# Patient Record
Sex: Female | Born: 1990
Health system: Southern US, Community
[De-identification: ages and names within clinical notes are randomized; demographics above are authoritative.]

---
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
## 2015-01-20 ENCOUNTER — Emergency Department (HOSPITAL_COMMUNITY)
Admission: EM | Admit: 2015-01-20 | Discharge: 2015-01-20 | Disposition: A | Discharge status: Home / Self Care | Payer: BLUE CROSS/BLUE SHIELD | Attending: Emergency Medicine | Admitting: Emergency Medicine

## 2015-01-20 ENCOUNTER — Encounter (HOSPITAL_COMMUNITY): Payer: Self-pay | Admitting: *Deleted

## 2015-01-20 DIAGNOSIS — K068 Other specified disorders of gingiva and edentulous alveolar ridge: Secondary | ICD-10-CM | POA: Diagnosis not present

## 2015-01-20 DIAGNOSIS — K088 Other specified disorders of teeth and supporting structures: Secondary | ICD-10-CM | POA: Diagnosis present

## 2015-01-20 DIAGNOSIS — K029 Dental caries, unspecified: Secondary | ICD-10-CM | POA: Insufficient documentation

## 2015-01-20 LAB — CBC WITH DIFFERENTIAL/PLATELET
BASOS ABS: 0 10*3/uL (ref 0.0–0.1)
Basophils Relative: 0 % (ref 0–1)
EOS ABS: 0 10*3/uL (ref 0.0–0.7)
Eosinophils Relative: 0 % (ref 0–5)
HCT: 38.5 % (ref 36.0–46.0)
HEMOGLOBIN: 13.3 g/dL (ref 12.0–15.0)
LYMPHS ABS: 2.3 10*3/uL (ref 0.7–4.0)
Lymphocytes Relative: 41 % (ref 12–46)
MCH: 28.7 pg (ref 26.0–34.0)
MCHC: 34.5 g/dL (ref 30.0–36.0)
MCV: 83.2 fL (ref 78.0–100.0)
MONOS PCT: 13 % — AB (ref 3–12)
Monocytes Absolute: 0.7 10*3/uL (ref 0.1–1.0)
NEUTROS PCT: 46 % (ref 43–77)
Neutro Abs: 2.6 10*3/uL (ref 1.7–7.7)
Platelets: 243 10*3/uL (ref 150–400)
RBC: 4.63 MIL/uL (ref 3.87–5.11)
RDW: 12.6 % (ref 11.5–15.5)
WBC: 5.6 10*3/uL (ref 4.0–10.5)

## 2015-01-20 NOTE — ED Notes (Signed)
Declined W/C at D/C and was escorted to lobby by RN. 

## 2015-01-20 NOTE — Discharge Instructions (Signed)
Dental Care and Dentist Visits Follow-up with a dentist regarding your bleeding gums. Her platelets are normal. Dental care supports good overall health. Regular dental visits can also help you avoid dental pain, bleeding, infection, and other more serious health problems in the future. It is important to keep the mouth healthy because diseases in the teeth, gums, and other oral tissues can spread to other areas of the body. Some problems, such as diabetes, heart disease, and pre-term labor have been associated with poor oral health.  See your dentist every 6 months. If you experience emergency problems such as a toothache or broken tooth, go to the dentist right away. If you see your dentist regularly, you may catch problems early. It is easier to be treated for problems in the early stages.  WHAT TO EXPECT AT A DENTIST VISIT  Your dentist will look for many common oral health problems and recommend proper treatment. At your regular dental visit, you can expect:  Gentle cleaning of the teeth and gums. This includes scraping and polishing. This helps to remove the sticky substance around the teeth and gums (plaque). Plaque forms in the mouth shortly after eating. Over time, plaque hardens on the teeth as tartar. If tartar is not removed regularly, it can cause problems. Cleaning also helps remove stains.  Periodic X-rays. These pictures of the teeth and supporting bone will help your dentist assess the health of your teeth.  Periodic fluoride treatments. Fluoride is a natural mineral shown to help strengthen teeth. Fluoride treatmentinvolves applying a fluoride gel or varnish to the teeth. It is most commonly done in children.  Examination of the mouth, tongue, jaws, teeth, and gums to look for any oral health problems, such as:  Cavities (dental caries). This is decay on the tooth caused by plaque, sugar, and acid in the mouth. It is best to catch a cavity when it is small.  Inflammation of  the gums caused by plaque buildup (gingivitis).  Problems with the mouth or malformed or misaligned teeth.  Oral cancer or other diseases of the soft tissues or jaws. KEEP YOUR TEETH AND GUMS HEALTHY For healthy teeth and gums, follow these general guidelines as well as your dentist's specific advice:  Have your teeth professionally cleaned at the dentist every 6 months.  Brush twice daily with a fluoride toothpaste.  Floss your teeth daily.  Ask your dentist if you need fluoride supplements, treatments, or fluoride toothpaste.  Eat a healthy diet. Reduce foods and drinks with added sugar.  Avoid smoking. TREATMENT FOR ORAL HEALTH PROBLEMS If you have oral health problems, treatment varies depending on the conditions present in your teeth and gums.  Your caregiver will most likely recommend good oral hygiene at each visit.  For cavities, gingivitis, or other oral health disease, your caregiver will perform a procedure to treat the problem. This is typically done at a separate appointment. Sometimes your caregiver will refer you to another dental specialist for specific tooth problems or for surgery. SEEK IMMEDIATE DENTAL CARE IF:  You have pain, bleeding, or soreness in the gum, tooth, jaw, or mouth area.  A permanent tooth becomes loose or separated from the gum socket.  You experience a blow or injury to the mouth or jaw area. Document Released: 02/05/2011 Document Revised: 08/18/2011 Document Reviewed: 02/05/2011 [facility] Patient Information 2015 [place], [place]. This information is not intended to replace advice given to you by your health care provider. Make sure you discuss any questions you have with  your health care provider. ° °

## 2015-01-20 NOTE — ED Notes (Signed)
Pt reports pain and bleeding to gumline x 3 days.

## 2015-01-20 NOTE — ED Provider Notes (Signed)
History  This chart was scribed for non-physician practitioner, Catha Gosselin, PA-C,working with Cathren Laine, MD, by Karle Plumber, ED Scribe. This patient was seen in room TR06C/TR06C and the patient's care was started at 3:00 PM.  Chief Complaint  Patient presents with  . Dental Pain   The history is provided by the patient and medical records. No language interpreter was used.    HPI Comments:  Morgan Phillips is a 24 y.o. female who presents to the Emergency Department complaining of gum swelling that began four days ago. She reports associated gum bleeding when she brushes and reports brushing daily. She states she has had similar symptoms in the past secondary to a "bad" upper right-sided tooth. She reports not having been seen by a dentist in about two years. She has not taken anything for the pain. She denies modifying factors. She denies fever, chills, nausea or vomiting. She denies any easy bruising or petechiae on the legs. She states her last physical exam was approximately one year ago and she states it was normal.  History reviewed. No pertinent past medical history. History reviewed. No pertinent past surgical history. History reviewed. No pertinent family history. Social History  Substance Use Topics  . Smoking status: Never Smoker   . Smokeless tobacco: None  . Alcohol Use: No   OB History    No data available     Review of Systems  Constitutional: Negative for fever and chills.  HENT: Positive for dental problem.   Gastrointestinal: Negative for nausea and vomiting.    Allergies  Review of patient's allergies indicates no known allergies.  Home Medications   Prior to Admission medications   Not on File   Triage Vitals: BP 109/64 mmHg  Pulse 84  Temp(Src) 98.5 F (36.9 C) (Oral)  Resp 18  SpO2 100%  LMP 01/08/2015 Physical Exam  Constitutional: She is oriented to person, place, and time. She appears well-developed and well-nourished.  HENT:   Head: Normocephalic and atraumatic.  Mouth/Throat: Uvula is midline, oropharynx is clear and moist and mucous membranes are normal. No trismus in the jaw. Abnormal dentition. Dental caries present. No dental abscesses or uvula swelling.  Mild gum hyperplasia but without active bleeding.  Eyes: EOM are normal.  Neck: Normal range of motion.  Cardiovascular: Normal rate.   Pulmonary/Chest: Effort normal. No respiratory distress.  Abdominal: Soft.  Musculoskeletal: Normal range of motion.  Neurological: She is alert and oriented to person, place, and time.  Skin: Skin is warm and dry.  Psychiatric: She has a normal mood and affect. Her behavior is normal.  Nursing note and vitals reviewed.   ED Course  Procedures (including critical care time) DIAGNOSTIC STUDIES: Oxygen Saturation is 100% on RA, normal by my interpretation.   COORDINATION OF CARE: 3:04 PM- Will draw labs and refer to dentist. Pt verbalizes understanding and agrees to plan.  Medications - No data to display  Labs Review Labs Reviewed  CBC WITH DIFFERENTIAL/PLATELET    Imaging Review No results found.    EKG Interpretation None      MDM   Final diagnoses:  Bleeding gums  Patient presents for gum swelling after brushing her teeth for the past 3 days. Vitals are stable. I checked platelets to make sure this was not related to thrombocytopenia. I referred the patient to a dentist. I discussed not using blistering or mouthwash until she has seen a dentist. She verbally agrees with the plan.  I personally performed the services described in  this documentation, which was scribed in my presence. The recorded information has been reviewed and is accurate.    Catha Gosselin, PA-C 01/20/15 1614  Cathren Laine, MD 01/21/15 [phone]

## 2018-01-12 ENCOUNTER — Emergency Department (HOSPITAL_BASED_OUTPATIENT_CLINIC_OR_DEPARTMENT_OTHER): Payer: Self-pay

## 2018-01-12 ENCOUNTER — Encounter (HOSPITAL_BASED_OUTPATIENT_CLINIC_OR_DEPARTMENT_OTHER): Payer: Self-pay | Admitting: *Deleted

## 2018-01-12 ENCOUNTER — Other Ambulatory Visit: Payer: Self-pay

## 2018-01-12 ENCOUNTER — Emergency Department (HOSPITAL_BASED_OUTPATIENT_CLINIC_OR_DEPARTMENT_OTHER)
Admission: EM | Admit: 2018-01-12 | Discharge: 2018-01-12 | Disposition: A | Discharge status: Home / Self Care | Payer: Self-pay | Attending: Emergency Medicine | Admitting: Emergency Medicine

## 2018-01-12 DIAGNOSIS — N10 Acute tubulo-interstitial nephritis: Secondary | ICD-10-CM | POA: Insufficient documentation

## 2018-01-12 DIAGNOSIS — N12 Tubulo-interstitial nephritis, not specified as acute or chronic: Secondary | ICD-10-CM

## 2018-01-12 LAB — URINALYSIS, ROUTINE W REFLEX MICROSCOPIC
Bilirubin Urine: NEGATIVE
Glucose, UA: NEGATIVE mg/dL
Ketones, ur: NEGATIVE mg/dL
NITRITE: NEGATIVE
Protein, ur: NEGATIVE mg/dL
SPECIFIC GRAVITY, URINE: 1.015 (ref 1.005–1.030)
pH: 6 (ref 5.0–8.0)

## 2018-01-12 LAB — URINALYSIS, MICROSCOPIC (REFLEX)

## 2018-01-12 LAB — COMPREHENSIVE METABOLIC PANEL
ALT: 15 U/L (ref 0–44)
AST: 22 U/L (ref 15–41)
Albumin: 4.5 g/dL (ref 3.5–5.0)
Alkaline Phosphatase: 58 U/L (ref 38–126)
Anion gap: 12 (ref 5–15)
BILIRUBIN TOTAL: 0.7 mg/dL (ref 0.3–1.2)
BUN: 12 mg/dL (ref 6–20)
CHLORIDE: 102 mmol/L (ref 98–111)
CO2: 24 mmol/L (ref 22–32)
CREATININE: 0.99 mg/dL (ref 0.44–1.00)
Calcium: 9 mg/dL (ref 8.9–10.3)
GFR calc Af Amer: >60 mL/min (ref >60)
GFR calc non Af Amer: >60 mL/min (ref >60)
GLUCOSE: 102 mg/dL — AB (ref 70–99)
Potassium: 3.8 mmol/L (ref 3.5–5.1)
Sodium: 138 mmol/L (ref 135–145)
TOTAL PROTEIN: 8.9 g/dL — AB (ref 6.5–8.1)

## 2018-01-12 LAB — CBC WITH DIFFERENTIAL/PLATELET
BASOS ABS: 0 10*3/uL (ref 0.0–0.1)
Basophils Relative: 0 %
Eosinophils Absolute: 0 10*3/uL (ref 0.0–0.7)
Eosinophils Relative: 0 %
HEMATOCRIT: 40.9 % (ref 36.0–46.0)
Hemoglobin: 14.2 g/dL (ref 12.0–15.0)
LYMPHS PCT: 22 %
Lymphs Abs: 2 10*3/uL (ref 0.7–4.0)
MCH: 28.3 pg (ref 26.0–34.0)
MCHC: 34.7 g/dL (ref 30.0–36.0)
MCV: 81.5 fL (ref 78.0–100.0)
Monocytes Absolute: 1.3 10*3/uL — ABNORMAL HIGH (ref 0.1–1.0)
Monocytes Relative: 14 %
NEUTROS ABS: 6 10*3/uL (ref 1.7–7.7)
NEUTROS PCT: 64 %
Platelets: 271 10*3/uL (ref 150–400)
RBC: 5.02 MIL/uL (ref 3.87–5.11)
RDW: 12.8 % (ref 11.5–15.5)
WBC: 9.3 10*3/uL (ref 4.0–10.5)

## 2018-01-12 LAB — LIPASE, BLOOD: LIPASE: 26 U/L (ref 11–51)

## 2018-01-12 LAB — PREGNANCY, URINE: PREG TEST UR: NEGATIVE

## 2018-01-12 MED ORDER — IOPAMIDOL (ISOVUE-300) INJECTION 61%
100.0000 mL | Freq: Once | INTRAVENOUS | Status: AC | PRN
  Administered 2018-01-12: 100 mL via INTRAVENOUS

## 2018-01-12 MED ORDER — SULFAMETHOXAZOLE-TRIMETHOPRIM 800-160 MG PO TABS: 1.0000 | ORAL_TABLET | Freq: Two times a day (BID) | ORAL | 0 refills | Status: AC

## 2018-01-12 MED ORDER — SODIUM CHLORIDE 0.9 % IV SOLN
1.0000 g | Freq: Once | INTRAVENOUS | Status: AC
  Administered 2018-01-12: 1 g via INTRAVENOUS
  Filled 2018-01-12: qty 10

## 2018-01-12 MED ORDER — SULFAMETHOXAZOLE-TRIMETHOPRIM 800-160 MG PO TABS: 1.0000 | ORAL_TABLET | Freq: Two times a day (BID) | ORAL | 0 refills | Status: DC

## 2018-01-12 MED FILL — SULFAMETHOXAZOLE-TMP DS TAB: 800-160 | 14 days supply | Qty: 28 | Fill #0

## 2018-01-12 NOTE — ED Triage Notes (Signed)
Pt has had right sided flank pain radiating to back since Saturday. Pt is has nausea, and sharp pain, unable to eat much.

## 2018-01-12 NOTE — Discharge Instructions (Addendum)
If you get a fever, increased pain, or feel worse please come back to emergency room.  Please follow-up with your primary care doctor within 1 to 2 weeks of discharge.

## 2018-01-12 NOTE — ED Provider Notes (Signed)
[facility] EMERGENCY DEPARTMENT Provider Note   CSN: [id number] Arrival date & time: 01/12/18  1029     History   Chief Complaint Chief Complaint  Patient presents with  . Flank Pain    HPI Morgan Phillips is a 27 y.o. female.  Patient is a 27 year old female with no significant past medical history presenting with 3 days of right lower quadrant pain.  Patient says that pain begins in right lower quadrant and radiates to her right lower back.  Patient notes that she has also had associated nausea and one episode of emesis on Sunday.  Patient also had diarrhea on Saturday but this has stopped.  Patient initially thought she had a viral or enteritis which is why she did not seek care immediately.  Patient notes associated headache as well and she says she can "barely walk".  Patient notes dizziness and lightheadedness.  Patient notes urinary urgency and frequency but denies hematuria or dysuria.  Patient recently had menses which ended on Sunday.  Patient's last unprotected sexual intercourse was before her menses began.  Patient reports subjective fever and chills yesterday.  Patient denies any sick contacts.  Patient notes a decrease in appetite, says she has not been able to eat or drink much.     History reviewed. No pertinent past medical history.  There are no active problems to display for this patient.   History reviewed. No pertinent surgical history.   OB History   None      Home Medications    Prior to Admission medications   Medication Sig Start Date End Date Taking? Authorizing Provider  sulfamethoxazole-trimethoprim (BACTRIM DS,SEPTRA DS) 800-160 MG tablet Take 1 tablet by mouth 2 (two) times daily for 14 days. 01/12/18 01/26/18  Oralia Manis, DO    Family History History reviewed. No pertinent family history.  Social History Social History   Tobacco Use  . Smoking status: Never Smoker  . Smokeless tobacco: Never Used  Substance Use Topics  .  Alcohol use: No  . Drug use: No     Allergies   Patient has no known allergies.   Review of Systems Review of Systems  Constitutional: Positive for appetite change, chills and fever.  Respiratory:       Pain with deep breaths  Gastrointestinal: Positive for abdominal pain, diarrhea, nausea and vomiting. Negative for blood in stool.  Genitourinary: Positive for decreased urine volume, flank pain, frequency and urgency. Negative for dysuria and hematuria.  Musculoskeletal: Positive for back pain and gait problem.  Neurological: Positive for dizziness, light-headedness and headaches.     Physical Exam Updated Vital Signs BP 122/73 (BP Location: Right Arm)   Pulse 94   Temp 99.2 F (37.3 C) (Oral)   Resp 16   Ht 5\' 4"  (1.626 m)   Wt 78 kg (172 lb)   LMP 01/03/2018 (Exact Date)   SpO2 100%   BMI 29.52 kg/m   Physical Exam  Constitutional: She appears well-developed and well-nourished. No distress.  HENT:  Head: Normocephalic and atraumatic.  Mouth/Throat: No oropharyngeal exudate.  Eyes: Pupils are equal, round, and reactive to light. Conjunctivae are normal. Right eye exhibits no discharge. Left eye exhibits no discharge.  Neck: Neck supple.  Cardiovascular: Normal rate, regular rhythm and normal heart sounds.  No murmur heard. Pulmonary/Chest: Effort normal and breath sounds normal. No respiratory distress. She has no wheezes. She has no rales.  Abdominal: Soft. Bowel sounds are normal. She exhibits no mass. There is tenderness.  Tenderness to palpation of McBurney's point.  Negative psoas sign.  Negative obturator sign.  Musculoskeletal: Normal range of motion. She exhibits no edema or tenderness.  No CVA tenderness  Neurological: She is alert.  Skin: Skin is warm.  Psychiatric: She has a normal mood and affect.     ED Treatments / Results  Labs (all labs ordered are listed, but only abnormal results are displayed) Labs Reviewed  URINALYSIS, ROUTINE W REFLEX  MICROSCOPIC - Abnormal; Notable for the following components:      Result Value   Hgb urine dipstick SMALL (*)    Leukocytes, UA SMALL (*)    All other components within normal limits  CBC WITH DIFFERENTIAL/PLATELET - Abnormal; Notable for the following components:   Monocytes Absolute 1.3 (*)    All other components within normal limits  COMPREHENSIVE METABOLIC PANEL - Abnormal; Notable for the following components:   Glucose, Bld 102 (*)    Total Protein 8.9 (*)    All other components within normal limits  URINALYSIS, MICROSCOPIC (REFLEX) - Abnormal; Notable for the following components:   Bacteria, UA FEW (*)    All other components within normal limits  URINE CULTURE  PREGNANCY, URINE  LIPASE, BLOOD    EKG None  Radiology Ct Abdomen Pelvis W Contrast  Result Date: 01/12/2018 CLINICAL DATA:  Rt sided abd pain since sat, n/v/d sat and sun, now having constipation. EXAM: CT ABDOMEN AND PELVIS WITH CONTRAST TECHNIQUE: Multidetector CT imaging of the abdomen and pelvis was performed using the standard protocol following bolus administration of intravenous contrast. CONTRAST:  100mL ISOVUE-300 IOPAMIDOL (ISOVUE-300) INJECTION 61% COMPARISON:  None. FINDINGS: Lower chest: No acute abnormality. Hepatobiliary: No focal liver abnormality is seen. No radiopaque gallstones, biliary dilatation, or pericholecystic inflammatory changes. Pancreas: Unremarkable. No pancreatic ductal dilatation or surrounding inflammatory changes. Spleen: Normal in size without focal abnormality. Adrenals/Urinary Tract: Adrenal glands are normal. There is symmetric enhancement of both kidneys. There is a small amount of fluid within the RIGHT renal pelvis and mild RIGHT-sided hydronephrosis. No ureteral obstruction identified. The LEFT kidney and ureter are normal in appearance. Urinary bladder is unremarkable. Stomach/Bowel: The stomach and small bowel loops are normal in appearance. The appendix is well seen and has a  normal appearance. There is moderate stool burden. Loops of colon are otherwise normal in appearance. Vascular/Lymphatic: No significant vascular findings are present. No enlarged abdominal or pelvic lymph nodes. Reproductive: Uterus and bilateral adnexa are unremarkable. Other: No abdominal wall hernia or abnormality. No abdominopelvic ascites. Musculoskeletal: No acute or significant osseous findings. IMPRESSION: 1. Mild RIGHT hydronephrosis, RIGHT renal pelvic fluid, and enhancing wall of the proximal ureter consistent with pyelonephritis. No evidence for abscess or urinary tract obstruction. 2. Normal appendix. 3. Moderate stool burden. Electronically Signed   By: Norva PavlovElizabeth  Brown M.D.   On: 01/12/2018 12:17    Procedures Procedures (including critical care time)  Medications Ordered in ED Medications  cefTRIAXone (ROCEPHIN) 1 g in sodium chloride 0.9 % 100 mL IVPB (has no administration in time range)  iopamidol (ISOVUE-300) 61 % injection 100 mL (100 mLs Intravenous Contrast Given 01/12/18 1146)     Initial Impression / Assessment and Plan / ED Course  I have reviewed the triage vital signs and the nursing notes.  Pertinent labs & imaging results that were available during my care of the patient were reviewed by me and considered in my medical decision making (see chart for details).     Patient with increased pain on  palpation of McBurney's point.  Given that there is tenderness to palpation right lower quadrant as well as history of anorexia, subjective fevers, and nausea/vomiting this is concerning for possible appendicitis.  Will obtain CT abdomen pelvis with contrast to rule out appendicitis.  Patient also with several urinary complaints such as urgency and frequency.  Will obtain UA as well as urine culture to rule out any urinary tract infections.  Will obtain urine pregnancy test to rule out possible ectopic pregnancy.  Will obtain lipase as well to rule out pancreatitis.  CBC and CMP  pending as well.  If CT is negative we will plan to do pelvic exam further work-up for pelvic pain and possible STD exposure.  12:40: CT scan showing mild right hydronephrosis, right renal pelvic fluid, and enhancing wall of the proximal ureter consistent with pyelonephritis.  No evidence of abscess or urinary tract obstruction seen in CT.  Normal appendix seen.  Moderate stool burden as well.  UA showing small leukocytes in small amount of hemoglobin as well as bacteria.  Pregnant negative.  Given these results, will treat for pyelonephritis with 1 g Rocephin in ED and then 14 days of Bactrim twice daily as outpatient.  Patient to follow-up with PCP in 1 to 2 weeks.  Strict return precautions given.  Patient stable for discharge.  Patient discussed and independently examined by Dr. Jacqulyn Bath who agrees with plan Final Clinical Impressions(s) / ED Diagnoses   Final diagnoses:  Pyelonephritis    ED Discharge Orders        Ordered    sulfamethoxazole-trimethoprim (BACTRIM DS,SEPTRA DS) 800-160 MG tablet  2 times daily     01/12/18 1240       Oralia Manis, DO 01/12/18 1247    Long, Arlyss Repress, MD 01/13/18 [phone]

## 2018-01-12 NOTE — ED Notes (Signed)
NAD at this time. Pt is stable and going home.  

## 2018-01-14 LAB — URINE CULTURE: Special Requests: NORMAL

## 2018-01-15 ENCOUNTER — Telehealth: Payer: Self-pay

## 2018-01-15 NOTE — Telephone Encounter (Signed)
Post ED Visit - Positive Culture Follow-up  Culture report reviewed by antimicrobial stewardship pharmacist:  []  Enzo BiNathan Batchelder, Pharm.D. []  Celedonio MiyamotoJeremy Frens, Pharm.D., BCPS AQ-ID []  Garvin FilaMike Maccia, Pharm.D., BCPS []  Georgina PillionElizabeth Martin, [address]Ph[address] BCPS []  UlenMinh Pham, [address]Pharm[address]D., BCPS, AAHIVP []  Estella HuskMichelle Turner, Pharm.D., BCPS, AAHIVP []  Lysle Pearlachel Rumbarger, PharmD, BCPS []  Phillips Climeshuy Dang, PharmD, BCPS []  Agapito GamesAlison Masters, PharmD, BCPS []  Verlan FriendsErin Deja, PharmD Philomena Coursehompson Pharm D Positive urine culture Treated with Bactrim DS, organism sensitive to the same and no further patient follow-up is required at this time.  Jerry CarasCullom, Alexsys Eskin Burnett 01/15/2018, 10:35 AM

## 2019-01-26 IMAGING — CT CT ABD-PELV W/ CM
2 of 4 series · 16 of 46 positions shown, 18 images · IV contrast (APPLIED)
Comparison: None.

CLINICAL DATA: Rt sided abd pain since sat, n/v/d sat and sun, now
having constipation.

EXAM:
CT ABDOMEN AND PELVIS WITH CONTRAST
TECHNIQUE: Multidetector CT imaging of the abdomen and pelvis was performed
using the standard protocol following bolus administration of
intravenous contrast.
CONTRAST:  100mL 0BB1K2-YMM IOPAMIDOL (0BB1K2-YMM) INJECTION 61%

[Series 2: axial st · axial · 0.97mm/px · z∈[-482,-47]mm · 13 of 95 slices shown, 15 images]
[im 4/95  soft-tissue]
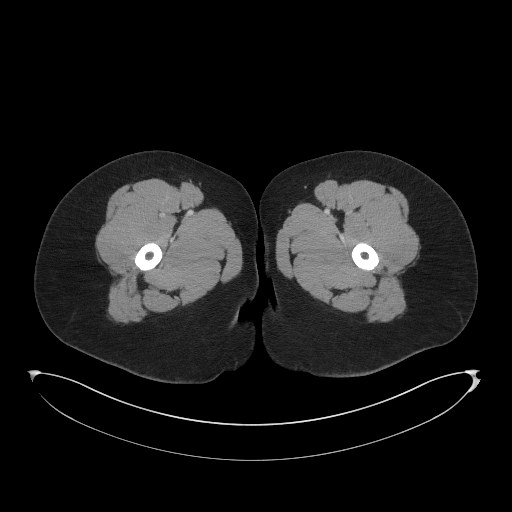
[im 4/95  bone]
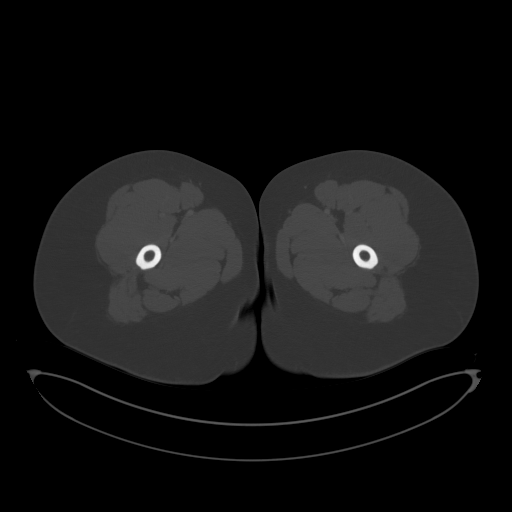
[im 12/95  soft-tissue]
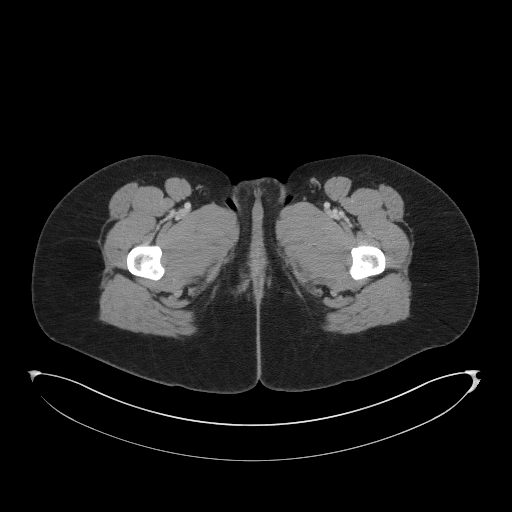
[im 19/95  soft-tissue]
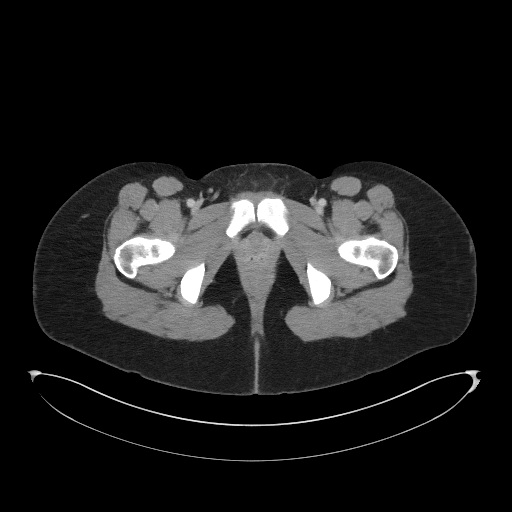
[im 27/95  soft-tissue]
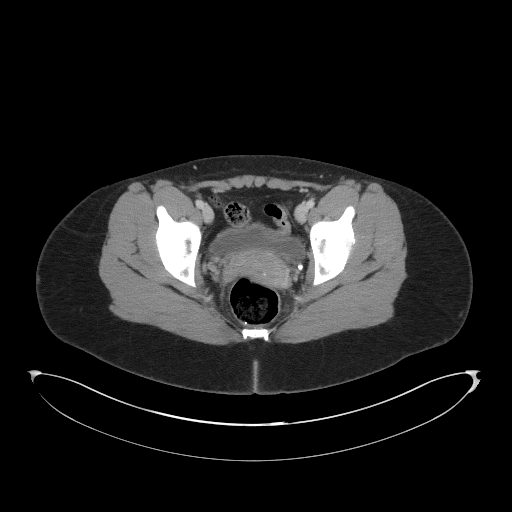
[im 34/95  soft-tissue]
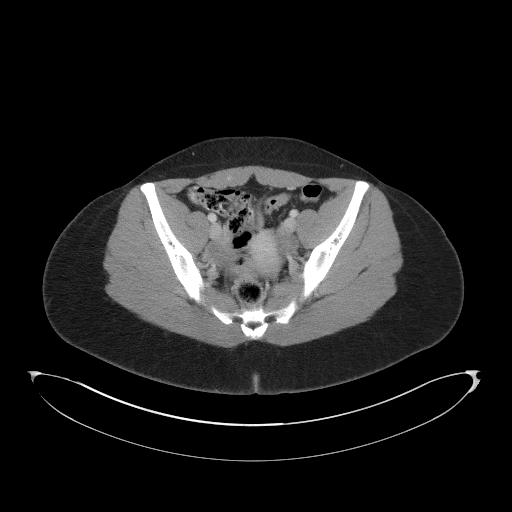
[im 42/95  soft-tissue]
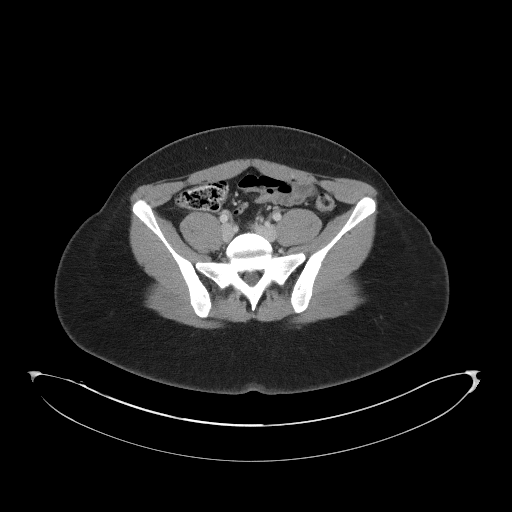
[im 49/95  soft-tissue]
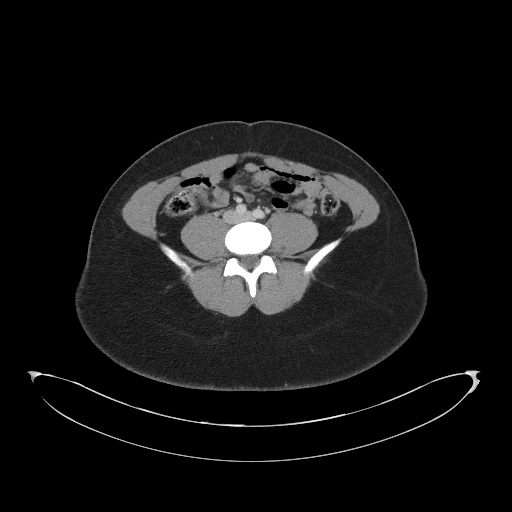
[im 53/95  soft-tissue]
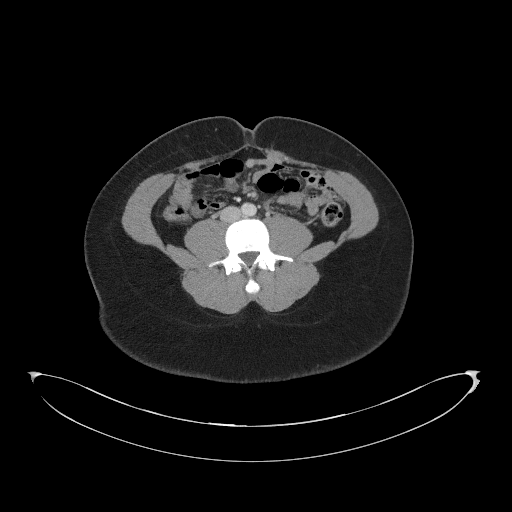
[im 61/95  soft-tissue]
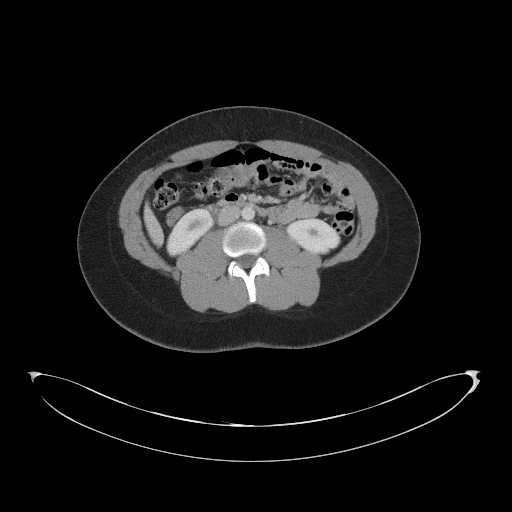
[im 61/95  bone]
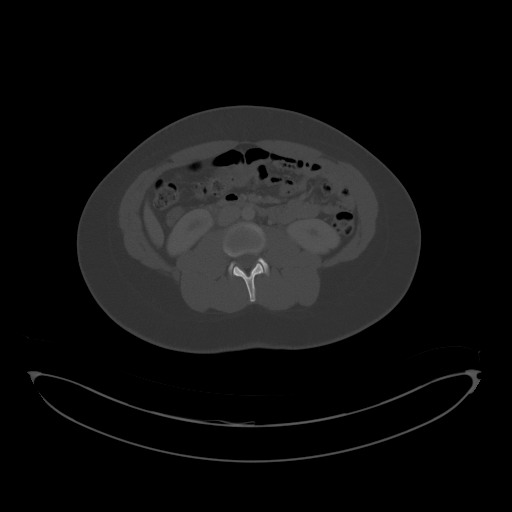
[im 68/95  soft-tissue]
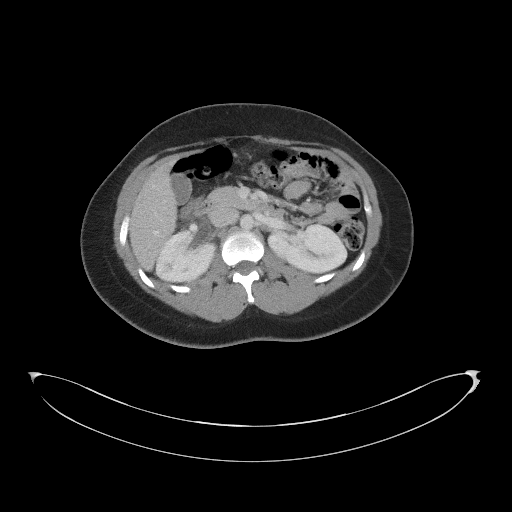
[im 76/95  soft-tissue]
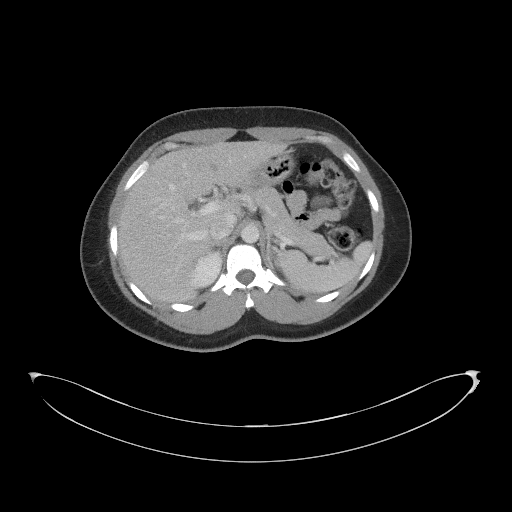
[im 83/95  soft-tissue]
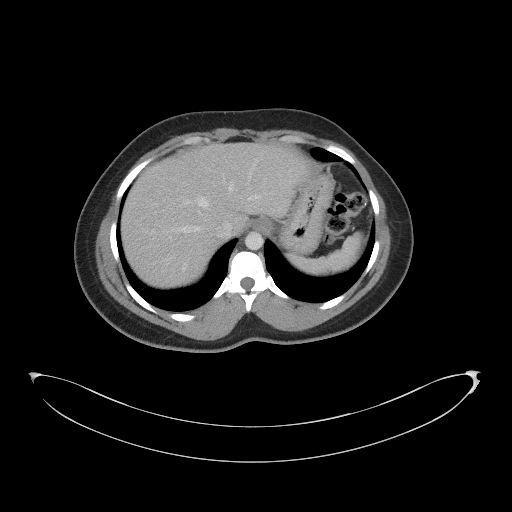
[im 91/95  soft-tissue]
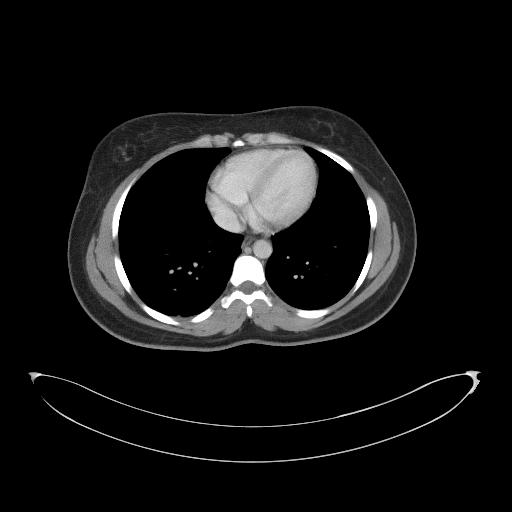

[Series 5: coronal st · coronal · 0.77mm/px · 3 of 93 slices shown]
[im 31/93  soft-tissue]
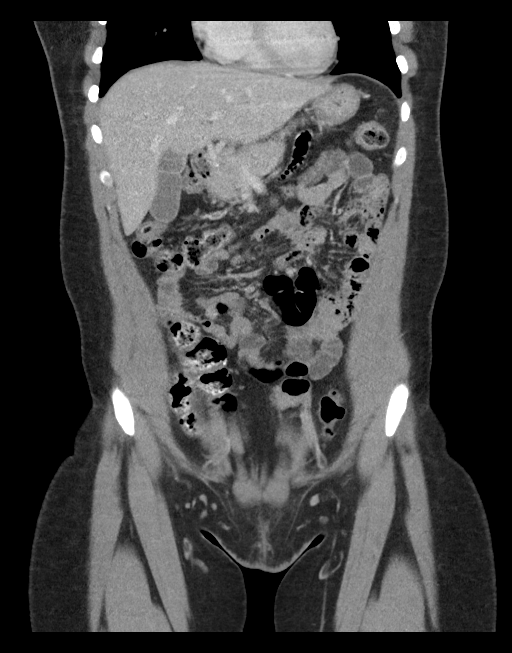
[im 41/93  soft-tissue]
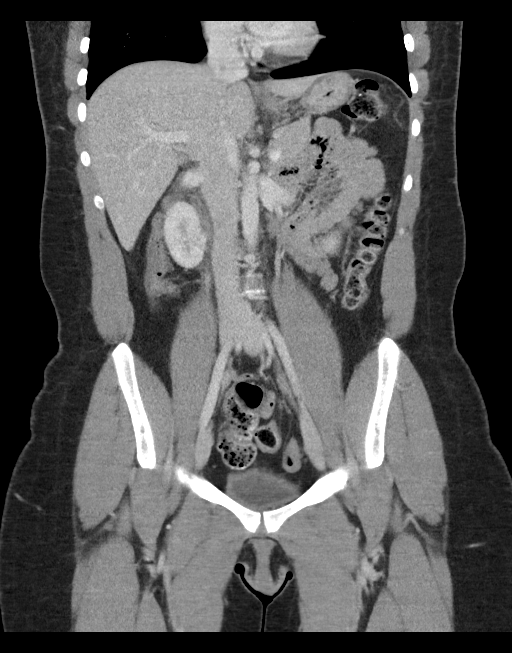
[im 52/93  soft-tissue]
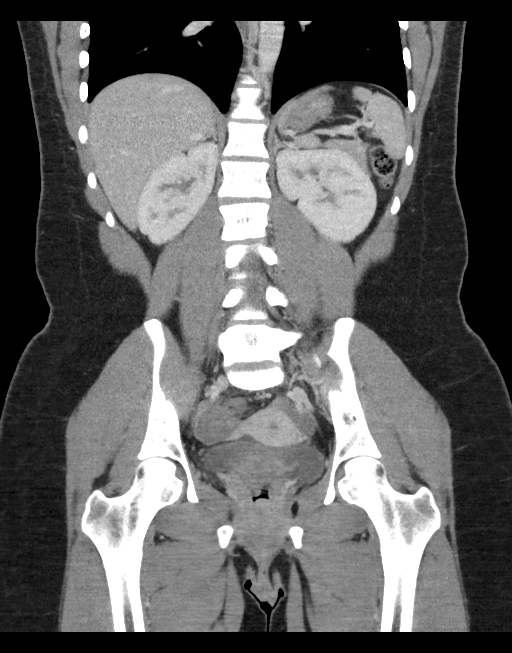

[16 of 46 positions shown; findings below may reference images not displayed]

FINDINGS: Lower chest: No acute abnormality.

Hepatobiliary: No focal liver abnormality is seen. No radiopaque
gallstones, biliary dilatation, or pericholecystic inflammatory
changes.

Pancreas: Unremarkable. No pancreatic ductal dilatation or
surrounding inflammatory changes.

Spleen: Normal in size without focal abnormality.

Adrenals/Urinary Tract: Adrenal glands are normal. There is
symmetric enhancement of both kidneys. There is a small amount of
fluid within the RIGHT renal pelvis and mild RIGHT-sided
hydronephrosis. No ureteral obstruction identified. The LEFT kidney
and ureter are normal in appearance. Urinary bladder is
unremarkable.

Stomach/Bowel: The stomach and small bowel loops are normal in
appearance. The appendix is well seen and has a normal appearance.
There is moderate stool burden. Loops of colon are otherwise normal
in appearance.

Vascular/Lymphatic: No significant vascular findings are present. No
enlarged abdominal or pelvic lymph nodes.

Reproductive: Uterus and bilateral adnexa are unremarkable.

Other: No abdominal wall hernia or abnormality. No abdominopelvic
ascites.

Musculoskeletal: No acute or significant osseous findings.
IMPRESSION: 1. Mild RIGHT hydronephrosis, RIGHT renal pelvic fluid, and
enhancing wall of the proximal ureter consistent with
pyelonephritis. No evidence for abscess or urinary tract
obstruction.
2. Normal appendix.
3. Moderate stool burden.

## 2019-07-06 ENCOUNTER — Ambulatory Visit: Payer: Self-pay | Admitting: Obstetrics and Gynecology

## 2019-07-06 NOTE — Progress Notes (Deleted)
   PCP:  Patient, No Pcp Per   No chief complaint on file.    HPI:      Ms. Morgan Phillips is a 29 y.o. No obstetric history on file. who LMP was No LMP recorded., presents today for her NP annual examination.  Her menses are {norm/abn:715}, lasting {number:22536} days.  Dysmenorrhea {dysmen:716}. She {does:18564} have intermenstrual bleeding.  Sex activity: {sex active:315163}.  Last Pap: {YSAY:[id number]}  Results were: {norm/abn:16707::"no abnormalities"} /neg HPV DNA *** Hx of STDs: {STD hx:14358}  There is no FH of breast cancer. There is no FH of ovarian cancer. The patient {does:18564} do self-breast exams.  Tobacco use: {tob:20664} Alcohol use: {Alcohol:11675} No drug use.  Exercise: {exercise:31265}  She {does:18564} get adequate calcium and Vitamin D in her diet.   There are no problems to display for this patient.   No past surgical history on file.  No family history on file.  Social History   Socioeconomic History  . Marital status: Single    Spouse name: Not on file  . Number of children: Not on file  . Years of education: Not on file  . Highest education level: Not on file  Occupational History  . Not on file  Tobacco Use  . Smoking status: Never Smoker  . Smokeless tobacco: Never Used  Substance and Sexual Activity  . Alcohol use: No  . Drug use: No  . Sexual activity: Yes    Birth control/protection: None  Other Topics Concern  . Not on file  Social History Narrative  . Not on file   Social Determinants of Health   Financial Resource Strain:   . Difficulty of Paying Living Expenses: Not on file  Food Insecurity:   . Worried About Programme researcher, broadcasting/film/video in the Last Year: Not on file  . Ran Out of Food in the Last Year: Not on file  Transportation Needs:   . Lack of Transportation (Medical): Not on file  . Lack of Transportation (Non-Medical): Not on file  Physical Activity:   . Days of Exercise per Week: Not on file  . Minutes of Exercise  per Session: Not on file  Stress:   . Feeling of Stress : Not on file  Social Connections:   . Frequency of Communication with Friends and Family: Not on file  . Frequency of Social Gatherings with Friends and Family: Not on file  . Attends Religious Services: Not on file  . Active Member of Clubs or Organizations: Not on file  . Attends Banker Meetings: Not on file  . Marital Status: Not on file  Intimate Partner Violence:   . Fear of Current or Ex-Partner: Not on file  . Emotionally Abused: Not on file  . Physically Abused: Not on file  . Sexually Abused: Not on file    No current outpatient medications on file.     ROS:  Review of Systems BREAST: No symptoms   Objective: There were no vitals taken for this visit.   OBGyn Exam  Results: No results found for this or any previous visit (from the past 24 hour(s)).  Assessment/Plan: No diagnosis found.  No orders of the defined types were placed in this encounter.            GYN counsel {counseling:16159}     F/U  No follow-ups on file.  Olen Eaves B. Wayburn Shaler, PA-C 07/06/2019 10:44 AM

## 2019-07-26 ENCOUNTER — Ambulatory Visit: Payer: Self-pay | Admitting: Obstetrics and Gynecology

## 2019-07-26 NOTE — Progress Notes (Deleted)
   PCP:  Patient, No Pcp Per   No chief complaint on file.    HPI:      Ms. Morgan Phillips is a 29 y.o. No obstetric history on file. who LMP was No LMP recorded., presents today for her NP annual examination.  Her menses are {norm/abn:715}, lasting {number:22536} days.  Dysmenorrhea {dysmen:716}. She {does:18564} have intermenstrual bleeding.  Sex activity: {sex active:315163}.  Last Pap: {EXHB:716967893}  Results were: {norm/abn:16707::"no abnormalities"} /neg HPV DNA *** Hx of STDs: {STD hx:14358}  Last mammogram: {date:304500300}  Results were: {norm/abn:13465} There is no FH of breast cancer. There is no FH of ovarian cancer. The patient {does:18564} do self-breast exams.  Tobacco use: {tob:20664} Alcohol use: {Alcohol:11675} No drug use.  Exercise: {exercise:31265}  She {does:18564} get adequate calcium and Vitamin D in her diet.   No past medical history on file.  No past surgical history on file.  No family history on file.  Social History   Socioeconomic History  . Marital status: Single    Spouse name: Not on file  . Number of children: Not on file  . Years of education: Not on file  . Highest education level: Not on file  Occupational History  . Not on file  Tobacco Use  . Smoking status: Never Smoker  . Smokeless tobacco: Never Used  Substance and Sexual Activity  . Alcohol use: No  . Drug use: No  . Sexual activity: Yes    Birth control/protection: None  Other Topics Concern  . Not on file  Social History Narrative  . Not on file   Social Determinants of Health   Financial Resource Strain:   . Difficulty of Paying Living Expenses: Not on file  Food Insecurity:   . Worried About Programme researcher, broadcasting/film/video in the Last Year: Not on file  . Ran Out of Food in the Last Year: Not on file  Transportation Needs:   . Lack of Transportation (Medical): Not on file  . Lack of Transportation (Non-Medical): Not on file  Physical Activity:   . Days of Exercise  per Week: Not on file  . Minutes of Exercise per Session: Not on file  Stress:   . Feeling of Stress : Not on file  Social Connections:   . Frequency of Communication with Friends and Family: Not on file  . Frequency of Social Gatherings with Friends and Family: Not on file  . Attends Religious Services: Not on file  . Active Member of Clubs or Organizations: Not on file  . Attends Banker Meetings: Not on file  . Marital Status: Not on file  Intimate Partner Violence:   . Fear of Current or Ex-Partner: Not on file  . Emotionally Abused: Not on file  . Physically Abused: Not on file  . Sexually Abused: Not on file    No current outpatient medications on file.     ROS:  Review of Systems BREAST: No symptoms   Objective: There were no vitals taken for this visit.   OBGyn Exam  Results: No results found for this or any previous visit (from the past 24 hour(s)).  Assessment/Plan: No diagnosis found.  No orders of the defined types were placed in this encounter.            GYN counsel {counseling:16159}     F/U  No follow-ups on file.   B. , PA-C 07/26/2019 10:56 AM

## 2019-09-21 ENCOUNTER — Other Ambulatory Visit: Payer: Self-pay

## 2019-09-21 ENCOUNTER — Ambulatory Visit (INDEPENDENT_AMBULATORY_CARE_PROVIDER_SITE_OTHER): Payer: Self-pay | Admitting: Obstetrics and Gynecology

## 2019-09-21 ENCOUNTER — Encounter: Payer: Self-pay | Admitting: Obstetrics and Gynecology

## 2019-09-21 VITALS — BP 120/60 | Ht 63.0 in | Wt 214.0 lb

## 2019-09-21 DIAGNOSIS — N912 Amenorrhea, unspecified: Secondary | ICD-10-CM

## 2019-09-21 DIAGNOSIS — Z113 Encounter for screening for infections with a predominantly sexual mode of transmission: Secondary | ICD-10-CM

## 2019-09-21 DIAGNOSIS — Z124 Encounter for screening for malignant neoplasm of cervix: Secondary | ICD-10-CM

## 2019-09-21 DIAGNOSIS — Z3169 Encounter for other general counseling and advice on procreation: Secondary | ICD-10-CM

## 2019-09-21 DIAGNOSIS — Z01419 Encounter for gynecological examination (general) (routine) without abnormal findings: Secondary | ICD-10-CM

## 2019-09-21 LAB — POCT URINE PREGNANCY: Preg Test, Ur: NEGATIVE

## 2019-09-21 MED ORDER — MEDROXYPROGESTERONE ACETATE 10 MG PO TABS: 10.0000 mg | ORAL_TABLET | Freq: Every day | ORAL | 0 refills | Status: AC

## 2019-09-21 NOTE — Patient Instructions (Signed)
I value your feedback and entrusting us with your care. If you get a [facility] patient survey, I would appreciate you taking the time to let us know about your experience today. Thank you!  As of May 19, 2019, your lab results will be released to your MyChart immediately, before I even have a chance to see them. Please give me time to review them and contact you if there are any abnormalities. Thank you for your patience.  

## 2019-09-21 NOTE — Progress Notes (Signed)
 PCP:  Patient, No Pcp Per   Chief Complaint  Patient presents with  . Gynecologic Exam  . Amenorrhea    was having cycles every month, last one in Nov, 2 UPT's both neg     HPI:      Ms. Morgan Phillips is a 29 y.o. No obstetric history on file. who LMP was Patient's last menstrual period was 05/02/2019 (approximate)., presents today for her NP annual examination.  Her menses were regular every 28-30 days, lasting 6 days until 11/20. No menses since, no BTB.  Dysmenorrhea mild, occurring first 1-2 days of flow. She does not have intermenstrual bleeding. Has had 2 neg UPTs, last one 3/21. No increased stress/sickness/wt changes. Never had menstural irreg in past.  Sex activity: single partner, contraception - none. Trying to get pregnant for a yr without conception. Not taking PNVs. Never had pregnancy, no hx of STDs. Partner with 8 yo child. No hx of testicular trauma/surg/STDs.  Last Pap: not recent; no hx of abn Hx of STDs: none  There is no FH of breast cancer. There is no FH of ovarian cancer. The patient does not do self-breast exams.  Tobacco use: The patient denies current or previous tobacco use. Alcohol use: social drinker No drug use.  Exercise: not active  She does get adequate calcium but not Vitamin D in her diet.  PT IS SELF PAY  History reviewed. No pertinent past medical history.  History reviewed. No pertinent surgical history.  Family History  Problem Relation Age of Onset  . Breast cancer Neg Hx   . Ovarian cancer Neg Hx     Social History   Socioeconomic History  . Marital status: Single    Spouse name: Not on file  . Number of children: Not on file  . Years of education: Not on file  . Highest education level: Not on file  Occupational History  . Not on file  Tobacco Use  . Smoking status: Never Smoker  . Smokeless tobacco: Never Used  Substance and Sexual Activity  . Alcohol use: No  . Drug use: No  . Sexual activity: Yes    Birth  control/protection: None  Other Topics Concern  . Not on file  Social History Narrative  . Not on file   Social Determinants of Health   Financial Resource Strain:   . Difficulty of Paying Living Expenses:   Food Insecurity:   . Worried About Running Out of Food in the Last Year:   . Ran Out of Food in the Last Year:   Transportation Needs:   . Lack of Transportation (Medical):   . Lack of Transportation (Non-Medical):   Physical Activity:   . Days of Exercise per Week:   . Minutes of Exercise per Session:   Stress:   . Feeling of Stress :   Social Connections:   . Frequency of Communication with Friends and Family:   . Frequency of Social Gatherings with Friends and Family:   . Attends Religious Services:   . Active Member of Clubs or Organizations:   . Attends Club or Organization Meetings:   . Marital Status:   Intimate Partner Violence:   . Fear of Current or Ex-Partner:   . Emotionally Abused:   . Physically Abused:   . Sexually Abused:      Current Outpatient Medications:  .  medroxyPROGESTERone (PROVERA) 10 MG tablet, Take 1 tablet (10 mg total) by mouth daily for 7 days., Disp: 7 tablet, Rfl:   0     ROS:  Review of Systems  Constitutional: Negative for fatigue, fever and unexpected weight change.  Respiratory: Negative for cough, shortness of breath and wheezing.   Cardiovascular: Negative for chest pain, palpitations and leg swelling.  Gastrointestinal: Negative for blood in stool, constipation, diarrhea, nausea and vomiting.  Endocrine: Negative for cold intolerance, heat intolerance and polyuria.  Genitourinary: Negative for dyspareunia, dysuria, flank pain, frequency, genital sores, hematuria, menstrual problem, pelvic pain, urgency, vaginal bleeding, vaginal discharge and vaginal pain.  Musculoskeletal: Negative for back pain, joint swelling and myalgias.  Skin: Negative for rash.  Neurological: Negative for dizziness, syncope, light-headedness,  numbness and headaches.  Hematological: Negative for adenopathy.  Psychiatric/Behavioral: Negative for agitation, confusion, sleep disturbance and suicidal ideas. The patient is not nervous/anxious.   BREAST: No symptoms   Objective: BP 120/60   Ht 5' 3" (1.6 m)   Wt 214 lb (97.1 kg)   LMP 05/02/2019 (Approximate)   BMI 37.91 kg/m    Physical Exam Constitutional:      Appearance: She is well-developed.  Genitourinary:     Vulva, vagina, cervix, uterus, right adnexa and left adnexa normal.     No vulval lesion or tenderness noted.     No vaginal discharge, erythema or tenderness.     No cervical polyp.     Uterus is not enlarged or tender.     No right or left adnexal mass present.     Right adnexa not tender.     Left adnexa not tender.  Neck:     Thyroid: No thyromegaly.  Cardiovascular:     Rate and Rhythm: Normal rate and regular rhythm.     Heart sounds: Normal heart sounds. No murmur.  Pulmonary:     Effort: Pulmonary effort is normal.     Breath sounds: Normal breath sounds.  Chest:     Breasts:        Right: No mass, nipple discharge, skin change or tenderness.        Left: No mass, nipple discharge, skin change or tenderness.  Abdominal:     Palpations: Abdomen is soft.     Tenderness: There is no abdominal tenderness. There is no guarding.  Musculoskeletal:        General: Normal range of motion.     Cervical back: Normal range of motion.  Neurological:     General: No focal deficit present.     Mental Status: She is alert and oriented to person, place, and time.     Cranial Nerves: No cranial nerve deficit.  Skin:    General: Skin is warm and dry.  Psychiatric:        Mood and Affect: Mood normal.        Behavior: Behavior normal.        Thought Content: Thought content normal.        Judgment: Judgment normal.  Vitals reviewed.     Results: Results for orders placed or performed in visit on 09/21/19 (from the past 24 hour(s))  POCT urine  pregnancy     Status: Normal   Collection Time: 09/21/19  1:54 PM  Result Value Ref Range   Preg Test, Ur Negative Negative    Assessment/Plan: Encounter for annual routine gynecological examination  Cervical cancer screening - Plan: Other/Misc lab test, pap  Screening for STD (sexually transmitted disease) - Plan: Other/Misc lab test, leukorrhea  Amenorrhea - Plan: POCT urine pregnancy, TSH + free T4, Prolactin, medroxyPROGESTERone (PROVERA) 10   MG tablet; Neg UPT. Rx provera, check labs. Pt to f/u after provera use re: withdrawal bleed. If does bleed, will see if provera "resets" cycle. If no bleeding, then will eval further.  Infertility counseling--address amenorrhea first. If starts cycling again after provera, will have pt due urine ovulation pred kit and semen analysis. Start PNVs anyway.  Meds ordered this encounter  Medications  . medroxyPROGESTERone (PROVERA) 10 MG tablet    Sig: Take 1 tablet (10 mg total) by mouth daily for 7 days.    Dispense:  7 tablet    Refill:  0    Order Specific Question:   Supervising Provider    Answer:   Gae Dry [[id number]]             GYN counsel adequate intake of calcium and vitamin D, diet and exercise     F/U  Return in about 1 year (around 09/20/2020).  Chanice Brenton B. Sherif Millspaugh, PA-C 09/21/2019 2:09 PM

## 2019-09-22 LAB — TSH+FREE T4
Free T4: 1.33 ng/dL (ref 0.82–1.77)
TSH: 1.56 u[IU]/mL (ref 0.450–4.500)

## 2019-09-22 LAB — PROLACTIN: Prolactin: 12.3 ng/mL (ref 4.8–23.3)

## 2019-09-26 ENCOUNTER — Encounter: Payer: Self-pay | Admitting: Obstetrics and Gynecology

## 2019-10-12 ENCOUNTER — Encounter: Payer: Self-pay | Admitting: Obstetrics and Gynecology
